# Patient Record
Sex: Male | Born: 2000 | Race: Black or African American | Hispanic: No | Marital: Single | Smoking: Never smoker
Health system: Southern US, Community
[De-identification: ages and names within clinical notes are randomized; demographics above are authoritative.]

## PROBLEM LIST (undated history)

## (undated) DIAGNOSIS — J45909 Unspecified asthma, uncomplicated: Secondary | ICD-10-CM

---
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
## 2021-01-18 ENCOUNTER — Other Ambulatory Visit: Payer: Self-pay

## 2021-01-18 ENCOUNTER — Emergency Department
Admission: EM | Admit: 2021-01-18 | Discharge: 2021-01-19 | Disposition: A | Discharge status: Home / Self Care | Payer: Medicaid Other | Attending: Emergency Medicine | Admitting: Emergency Medicine

## 2021-01-18 ENCOUNTER — Encounter: Payer: Self-pay | Admitting: Emergency Medicine

## 2021-01-18 DIAGNOSIS — J45909 Unspecified asthma, uncomplicated: Secondary | ICD-10-CM | POA: Insufficient documentation

## 2021-01-18 DIAGNOSIS — R410 Disorientation, unspecified: Secondary | ICD-10-CM | POA: Insufficient documentation

## 2021-01-18 DIAGNOSIS — F4322 Adjustment disorder with anxiety: Secondary | ICD-10-CM | POA: Diagnosis present

## 2021-01-18 DIAGNOSIS — Z20822 Contact with and (suspected) exposure to covid-19: Secondary | ICD-10-CM | POA: Diagnosis not present

## 2021-01-18 DIAGNOSIS — B89 Unspecified parasitic disease: Secondary | ICD-10-CM | POA: Insufficient documentation

## 2021-01-18 DIAGNOSIS — Y900 Blood alcohol level of less than 20 mg/100 ml: Secondary | ICD-10-CM | POA: Insufficient documentation

## 2021-01-18 HISTORY — DX: Unspecified asthma, uncomplicated: J45.909

## 2021-01-18 LAB — COMPREHENSIVE METABOLIC PANEL
ALT: 25 U/L (ref 0–44)
AST: 30 U/L (ref 15–41)
Albumin: 4.3 g/dL (ref 3.5–5.0)
Alkaline Phosphatase: 54 U/L (ref 38–126)
Anion gap: 7 (ref 5–15)
BUN: 9 mg/dL (ref 6–20)
CO2: 27 mmol/L (ref 22–32)
Calcium: 9.1 mg/dL (ref 8.9–10.3)
Chloride: 103 mmol/L (ref 98–111)
Creatinine, Ser: 1.15 mg/dL (ref 0.61–1.24)
GFR, Estimated: >60 mL/min (ref >60)
Glucose, Bld: 138 mg/dL — ABNORMAL HIGH (ref 70–99)
Potassium: 3.5 mmol/L (ref 3.5–5.1)
Sodium: 137 mmol/L (ref 135–145)
Total Bilirubin: 1.1 mg/dL (ref 0.3–1.2)
Total Protein: 8.4 g/dL — ABNORMAL HIGH (ref 6.5–8.1)

## 2021-01-18 LAB — CBC
HCT: 39.1 % (ref 39.0–52.0)
Hemoglobin: 13.8 g/dL (ref 13.0–17.0)
MCH: 30.3 pg (ref 26.0–34.0)
MCHC: 35.3 g/dL (ref 30.0–36.0)
MCV: 85.7 fL (ref 80.0–100.0)
Platelets: 286 10*3/uL (ref 150–400)
RBC: 4.56 MIL/uL (ref 4.22–5.81)
RDW: 13.1 % (ref 11.5–15.5)
WBC: 6.3 10*3/uL (ref 4.0–10.5)
nRBC: 0 % (ref 0.0–0.2)

## 2021-01-18 LAB — ETHANOL: Alcohol, Ethyl (B): <10 mg/dL (ref <10)

## 2021-01-18 LAB — ACETAMINOPHEN LEVEL: Acetaminophen (Tylenol), Serum: <10 ug/mL — ABNORMAL LOW (ref 10–30)

## 2021-01-18 LAB — SALICYLATE LEVEL: Salicylate Lvl: <7 mg/dL — ABNORMAL LOW (ref 7.0–30.0)

## 2021-01-18 MED ORDER — HYDROXYZINE HCL 25 MG PO TABS: 50.0000 mg | ORAL_TABLET | Freq: Once | ORAL | Status: DC

## 2021-01-18 NOTE — ED Notes (Signed)
Pt given sandwich tray 

## 2021-01-18 NOTE — ED Triage Notes (Addendum)
Pt via EMS from home. Pt states he has flees eating at him. Pt states that his dogs had flees 2 days ago. Pt states he is itchy and scratchy. Pt asking if we can feel the flees. Pt is calm and cooperative during triage by has rapid speech and flight ideas. Denies SI/HI. Denies AVH.

## 2021-01-18 NOTE — BH Assessment (Signed)
Writer attempted to assess patient, patient is currently unable to be aroused at this time.

## 2021-01-18 NOTE — ED Provider Notes (Signed)
[facility] Emergency Department Provider Note  ____________________________________________   Event Date/Time   First MD Initiated Contact with Patient 01/18/21 1842     (approximate)  I have reviewed the triage vital signs and the nursing notes.   HISTORY  Chief Complaint Psychiatric Evaluation    HPI Donald Bryant is a 20 y.o. male here with paranoia and sensation of bugs crawling on him.  History is somewhat limited as patient is somewhat confused and evasive.  However, he states he is here because he feels like his body has been shrinking because insects, which she describes as fleas from dogs that he has been staying with, have been living on him and biting him.  He describes generalized itchiness.  He has not been sleeping.  He is lost appetite from this.  He denies any discrete areas of pain or swelling.  No medication changes.  He denies psychiatric history.  It is unclear why he presented today.  He has no documented psychiatric history here.  Denies drug use.    Past Medical History:  Diagnosis Date   Asthma     There are no problems to display for this patient.   History reviewed. No pertinent surgical history.  Prior to Admission medications   Not on File    Allergies Patient has no known allergies.  History reviewed. No pertinent family history.  Social History Social History   Tobacco Use   Smoking status: Never   Smokeless tobacco: Never  Substance Use Topics   Alcohol use: Never   Drug use: Yes    Types: Marijuana    Review of Systems  Review of Systems  Constitutional:  Negative for chills and fever.  HENT:  Negative for sore throat.   Respiratory:  Negative for shortness of breath.   Cardiovascular:  Negative for chest pain.  Gastrointestinal:  Negative for abdominal pain.  Genitourinary:  Negative for flank pain.  Musculoskeletal:  Negative for neck pain.  Skin:  Negative for rash and wound.   Allergic/Immunologic: Negative for immunocompromised state.  Neurological:  Negative for weakness and numbness.  Hematological:  Does not bruise/bleed easily.  Psychiatric/Behavioral:  Positive for hallucinations.   All other systems reviewed and are negative.   ____________________________________________  PHYSICAL EXAM:      VITAL SIGNS: ED Triage Vitals [01/18/21 1831]  Enc Vitals Group     BP (!) 137/93     Pulse Rate 87     Resp 20     Temp 98.4 F (36.9 C)     Temp Source Oral     SpO2 100 %     Weight 150 lb (68 kg)     Height 5\' 11"  (1.803 m)     Head Circumference      Peak Flow      Pain Score 0     Pain Loc      Pain Edu?      Excl. in GC?      Physical Exam Vitals and nursing note reviewed.  Constitutional:      General: He is not in acute distress.    Appearance: He is well-developed.  HENT:     Head: Normocephalic and atraumatic.  Eyes:     Conjunctiva/sclera: Conjunctivae normal.  Cardiovascular:     Rate and Rhythm: Normal rate and regular rhythm.     Heart sounds: Normal heart sounds.  Pulmonary:     Effort: Pulmonary effort is normal. No respiratory distress.  Breath sounds: No wheezing.  Abdominal:     General: There is no distension.  Musculoskeletal:     Cervical back: Neck supple.  Skin:    General: Skin is warm.     Capillary Refill: Capillary refill takes less than 2 seconds.     Findings: No rash.     Comments: Scattered, diffuse excoriations.  No visible insects noted.  Neurological:     Mental Status: He is alert and oriented to person, place, and time.     Motor: No abnormal muscle tone.  Psychiatric:     Comments: Paranoid, endorsing seeing fleas and insects on his skin that do not appear to be apparent.  Denies suicidal homicidal ideation.      ____________________________________________   LABS (all labs ordered are listed, but only abnormal results are displayed)  Labs Reviewed  COMPREHENSIVE METABOLIC PANEL -  Abnormal; Notable for the following components:      Result Value   Glucose, Bld 138 (*)    Total Protein 8.4 (*)    All other components within normal limits  SALICYLATE LEVEL - Abnormal; Notable for the following components:   Salicylate Lvl <7.0 (*)    All other components within normal limits  ACETAMINOPHEN LEVEL - Abnormal; Notable for the following components:   Acetaminophen (Tylenol), Serum <10 (*)    All other components within normal limits  ETHANOL  CBC  URINE DRUG SCREEN, QUALITATIVE ([facility] ONLY)    ____________________________________________  EKG:  ________________________________________  RADIOLOGY All imaging, including plain films, CT scans, and ultrasounds, independently reviewed by me, and interpretations confirmed via formal radiology reads.  ED MD interpretation:     Official radiology report(s): No results found.  ____________________________________________  PROCEDURES   Procedure(s) performed (including Critical Care):  Procedures  ____________________________________________  INITIAL IMPRESSION / MDM / ASSESSMENT AND PLAN / ED COURSE  As part of my medical decision making, I reviewed the following data within the electronic MEDICAL RECORD NUMBER Nursing notes reviewed and incorporated, Old chart reviewed, Notes from prior ED visits, and [place] Controlled Substance Database       *Donald Bryant was evaluated in Emergency Department on 01/18/2021 for the symptoms described in the history of present illness. He was evaluated in the context of the global COVID-19 pandemic, which necessitated consideration that the patient might be at risk for infection with the SARS-CoV-2 virus that causes COVID-19. Institutional protocols and algorithms that pertain to the evaluation of patients at risk for COVID-19 are in a state of rapid change based on information released by regulatory bodies including the CDC and federal and state organizations. These policies and  algorithms were followed during the patient's care in the ED.  Some ED evaluations and interventions may be delayed as a result of limited staffing during the pandemic.*     Medical Decision Making: 21 year old male here with concern for delusions of parasitosis with possible hallucinations/psychosis as well.  Unclear if this is primary psychiatric issue versus substance use.  Will send screening labs.  On exam, he does have some diffuse excoriations.  I see no visualized insects, but he does reportedly live in a poor living situation.  Would consider treating him empirically for possible flea bites if cleared psychiatrically. Denies any SI, HI, and beyond his reported itching/flea exposure, he does not appear overtly dangerous to himself or others, and does not meet IVC criteria.  ____________________________________________  FINAL CLINICAL IMPRESSION(S) / ED DIAGNOSES  Final diagnoses:  Parasitosis  MEDICATIONS GIVEN DURING THIS VISIT:  Medications  hydrOXYzine (ATARAX/VISTARIL) tablet 50 mg (50 mg Oral Not Given 01/18/21 1946)     ED Discharge Orders     None        Note:  This document was prepared using Dragon voice recognition software and may include unintentional dictation errors.   Shaune Pollack, MD 01/18/21 [phone]

## 2021-01-18 NOTE — ED Notes (Addendum)
Pt belonging include (1/1):   1 pair of dark blue shorts  1 black sweatshirt  4 rings  1 necklace  1 headband  1 belly ring

## 2021-01-18 NOTE — ED Triage Notes (Signed)
Pt in via EMS from home with c/o being eaten by fleas from the inside out causing his skin to shrink. EMS reports per pt the fleas facial expressions have changed today.

## 2021-01-18 NOTE — Discharge Instructions (Addendum)
If cleared for d/c, consider permethrin for reported flea bites/infestation  [facility]  [place] Address: [address], [place], [place] [address] Hours:  Open ? Closes 5PM 4th of July might affect these hours Phone: [phone]

## 2021-01-18 NOTE — ED Notes (Signed)
VOL/pending SOC consult 

## 2021-01-19 DIAGNOSIS — F4322 Adjustment disorder with anxiety: Secondary | ICD-10-CM

## 2021-01-19 LAB — RESP PANEL BY RT-PCR (FLU A&B, COVID) ARPGX2
Influenza A by PCR: NEGATIVE
Influenza B by PCR: NEGATIVE
SARS Coronavirus 2 by RT PCR: NEGATIVE

## 2021-01-19 NOTE — ED Notes (Signed)
TTS and NP at bedside to consult patient and plan further care.

## 2021-01-19 NOTE — Consult Note (Signed)
[facility] Psych ED Discharge  01/19/2021 10:08 AM Elford [place] NO  MRN:  [id number]  Method of visit?: Face to Face   Principal Problem: Adjustment disorder with anxious mood Discharge Diagnoses: Principal Problem:   Adjustment disorder with anxious mood   Subjective: "I feel much better."  20 yo male presented after being bit by fleas and getting anxious.  He stayed at a friend's house who had fleas and he got bit which was "freaking me out."  Today after sleeping he feels much better.  He had poor sleep for the past two weeks as he has been staying at friend's homes as because he did not want to go back to stay with his grandmother.  Now, he is happy to return as she does not have fleas at her home.  Denies suicidal/homicidal ideations, hallucinations, and substance abuse.  Pleasant, engaging, and cooperative on assessment.  Psychiatrically cleared for discharge.  On admission per TTS, Jamila Handy: Patient is a 20 year old male presenting to [facility] ED voluntarily due to feeling as though flees are eating him. Per triage note Pt via EMS from home. Pt states he has flees eating at him. Pt states that his dogs had flees 2 days ago. Pt states he is itchy and scratchy. Pt asking if we can feel the flees. Pt is calm and cooperative during triage by has rapid speech and flight ideas. Denies SI/HI. Denies AVH. During assessment patient appears alert and oriented x4, calm and cooperative, mood is pleasant. Patient reports "the house that I went to it's real nasty, the 2 dogs had real bad flees and the chair that I was sitting in something told me not to sit there because I knew it had flees on it." Patient reports that he has never experienced this before "I don't live like this, I live with grandma and she doesn't live like that." Patient reports that he started feeling the flees "3 days ago." Patient denies using any drugs or alcohol. Prior to the assessment patient was sleeping in his bed and unable to be  aroused, when asked how patient had been sleeping he reports "I haven't slept in 2 weeks, this was the first time I had slept" When asked if there is anything in particular that is affecting his sleep patient reports "I have been feeling depressed, I was kicked out my mom's house, she is really religious and doesn't accept me and that bothers me." Patient is now currently living with his grandmother "my grandmother is strict but she is more understanding." Patient reports that he has been feeling depressed since the age of 29 but has never attempted to hurt himself. Patient also reports a poor appetite. When asked if patient is still feeling the flees he reports "not as much as when I first came in, I can still feel them a little." Patient denies SI/HI/AH/VH  Total Time spent with patient: 1 hour  Past Psychiatric History: anxiety, depression  Past Medical History:  Past Medical History:  Diagnosis Date   Asthma    History reviewed. No pertinent surgical history. Family History: History reviewed. No pertinent family history. Family Psychiatric  History: none Social History:  Social History   Substance and Sexual Activity  Alcohol Use Never     Social History   Substance and Sexual Activity  Drug Use Yes   Types: Marijuana    Social History   Socioeconomic History   Marital status: Single    Spouse name: Not on file  Number of children: Not on file   Years of education: Not on file   Highest education level: Not on file  Occupational History   Not on file  Tobacco Use   Smoking status: Never   Smokeless tobacco: Never  Substance and Sexual Activity   Alcohol use: Never   Drug use: Yes    Types: Marijuana   Sexual activity: Never  Other Topics Concern   Not on file  Social History Narrative   Not on file   Social Determinants of Health   Financial Resource Strain: Not on file  Food Insecurity: Not on file  Transportation Needs: Not on file  Physical Activity: Not  on file  Stress: Not on file  Social Connections: Not on file    Tobacco Cessation:  A prescription for an FDA-approved tobacco cessation medication was offered at discharge and the patient refused  Current Medications: Current Facility-Administered Medications  Medication Dose Route Frequency Provider Last Rate Last Admin   hydrOXYzine (ATARAX/VISTARIL) tablet 50 mg  50 mg Oral Once Shaune Pollack, MD       No current outpatient medications on file.   PTA Medications: (Not in a hospital admission)   Musculoskeletal: Strength & Muscle Tone: within normal limits Gait & Station: normal Patient leans: N/A  Psychiatric Specialty Exam: Physical Exam Vitals and nursing note reviewed.  Constitutional:      Appearance: Normal appearance.  HENT:     Head: Normocephalic.     Nose: Nose normal.  Pulmonary:     Effort: Pulmonary effort is normal.  Musculoskeletal:        General: Normal range of motion.     Cervical back: Normal range of motion.  Neurological:     General: No focal deficit present.     Mental Status: He is alert and oriented to person, place, and time.  Psychiatric:        Attention and Perception: Attention and perception normal.        Mood and Affect: Mood is anxious.        Speech: Speech normal.        Behavior: Behavior normal. Behavior is cooperative.        Thought Content: Thought content normal.        Cognition and Memory: Cognition and memory normal.        Judgment: Judgment normal.    Review of Systems  Psychiatric/Behavioral:  The patient is nervous/anxious.   All other systems reviewed and are negative.  Blood pressure 134/67, pulse 63, temperature 98.4 F (36.9 C), temperature source Oral, resp. rate 18, height 5\' 11"  (1.803 m), weight 68 kg, SpO2 100 %.Body mass index is 20.92 kg/m.  General Appearance: Casual  Eye Contact:  Good  Speech:  Clear and Coherent  Volume:  Normal  Mood:  Anxious  Affect:  Congruent  Thought Process:   Coherent and Descriptions of Associations: Intact  Orientation:  Full (Time, Place, and Person)  Thought Content:  WDL and Logical  Suicidal Thoughts:  No  Homicidal Thoughts:  No  Memory:  Immediate;   Good Recent;   Good Remote;   Good  Judgement:  Fair  Insight:  Good  Psychomotor Activity:  Normal  Concentration:  Concentration: Good and Attention Span: Good  Recall:  Good  Fund of Knowledge:  Good  Language:  Good  Akathisia:  No  Handed:  Right  AIMS (if indicated):     Assets:  Housing Leisure Time Physical Health Resilience  Social Support  ADL's:  Intact  Cognition:  WNL  Sleep:         Physical Exam: Physical Exam Vitals and nursing note reviewed.  Constitutional:      Appearance: Normal appearance.  HENT:     Head: Normocephalic.     Nose: Nose normal.  Pulmonary:     Effort: Pulmonary effort is normal.  Musculoskeletal:        General: Normal range of motion.     Cervical back: Normal range of motion.  Neurological:     General: No focal deficit present.     Mental Status: He is alert and oriented to person, place, and time.  Psychiatric:        Attention and Perception: Attention and perception normal.        Mood and Affect: Mood is anxious.        Speech: Speech normal.        Behavior: Behavior normal. Behavior is cooperative.        Thought Content: Thought content normal.        Cognition and Memory: Cognition and memory normal.        Judgment: Judgment normal.   Review of Systems  Psychiatric/Behavioral:  The patient is nervous/anxious.   All other systems reviewed and are negative. Blood pressure 134/67, pulse 63, temperature 98.4 F (36.9 C), temperature source Oral, resp. rate 18, height 5\' 11"  (1.803 m), weight 68 kg, SpO2 100 %. Body mass index is 20.92 kg/m.   Demographic Factors:  Male and Adolescent or young adult  Loss Factors: NA  Historical Factors: NA  Risk Reduction Factors:   Sense of responsibility to family,  Employed, Living with another person, especially a relative, and Positive social support  Continued Clinical Symptoms:  Anxiety, mild  Cognitive Features That Contribute To Risk:  None    Suicide Risk:  Minimal: No identifiable suicidal ideation.  Patients presenting with no risk factors but with morbid ruminations; may be classified as minimal risk based on the severity of the depressive symptoms    Plan Of Care/Follow-up recommendations:  Adjustment disorder with anxious mood: -Follow up with [facility]  Activity:  as tolerated Diet:  heart healthy diet  Disposition: discharge home , NP 01/19/2021, 10:08 AM

## 2021-01-19 NOTE — ED Notes (Signed)
Pt asleep, breakfast tray placed on bed.  

## 2021-01-19 NOTE — BH Assessment (Signed)
Comprehensive Clinical Assessment (CCA) Note  01/19/2021 Donald Bryant [id number]  Chief Complaint: Patient is a 20 year old male presenting to [facility] ED voluntarily due to feeling as though flees are eating him. Per triage note Pt via EMS from home. Pt states he has flees eating at him. Pt states that his dogs had flees 2 days ago. Pt states he is itchy and scratchy. Pt asking if we can feel the flees. Pt is calm and cooperative during triage by has rapid speech and flight ideas. Denies SI/HI. Denies AVH. During assessment patient appears alert and oriented x4, calm and cooperative, mood is pleasant. Patient reports "the house that I went to it's real nasty, the 2 dogs had real bad flees and the chair that I was sitting in something told me not to sit there because I knew it had flees on it." Patient reports that he has never experienced this before "I don't live like this, I live with grandma and she doesn't live like that." Patient reports that he started feeling the flees "3 days ago." Patient denies using any drugs or alcohol. Prior to the assessment patient was sleeping in his bed and unable to be aroused, when asked how patient had been sleeping he reports "I haven't slept in 2 weeks, this was the first time I had slept" When asked if there is anything in particular that is affecting his sleep patient reports "I have been feeling depressed, I was kicked out my mom's house, she is really religious and doesn't accept me and that bothers me." Patient is now currently living with his grandmother "my grandmother is strict but she is more understanding." Patient reports that he has been feeling depressed since the age of 20 but has never attempted to hurt himself. Patient also reports a poor appetite. When asked if patient is still feeling the flees he reports "not as much as when I first came in, I can still feel them a little." Patient denies SI/HI/AH/VH  Pending psyc consult Chief Complaint  Patient  presents with   Psychiatric Evaluation       CCA Screening, Triage and Referral (STR)  Patient Reported Information How did you hear about us? Self  Referral name: No data recorded Referral phone number: No data recorded  Whom do you see for routine medical problems? No data recorded Practice/Facility Name: No data recorded Practice/Facility Phone Number: No data recorded Name of Contact: No data recorded Contact Number: No data recorded Contact Fax Number: No data recorded Prescriber Name: No data recorded Prescriber Address (if known): No data recorded  What Is the Reason for Your Visit/Call Today? Patient presented voluntarily due to feeling as though flees are crawling on him  How Long Has This Been Causing You Problems? <Week  What Do You Feel Would Help You the Most Today? No data recorded  Have You Recently Been in Any Inpatient Treatment (Hospital/Detox/Crisis Center/28-Day Program)? No data recorded Name/Location of Program/Hospital:No data recorded How Long Were You There? No data recorded When Were You Discharged? No data recorded  Have You Ever Received Services From [facility] Before? No data recorded Who Do You See at [facility]? No data recorded  Have You Recently Had Any Thoughts About Hurting Yourself? No  Are You Planning to Commit Suicide/Harm Yourself At This time? No   Have you Recently Had Thoughts About Hurting Someone Karolee Ohslse? No  Explanation: No data recorded  Have You Used Any Alcohol or Drugs in the Past 24 Hours?  No  How Long Ago Did You Use Drugs or Alcohol? No data recorded What Did You Use and How Much? No data recorded  Do You Currently Have a Therapist/Psychiatrist? No  Name of Therapist/Psychiatrist: No data recorded  Have You Been Recently Discharged From Any Office Practice or Programs? No  Explanation of Discharge From Practice/Program: No data recorded    CCA Screening Triage Referral Assessment Type of Contact:  Face-to-Face  Is this Initial or Reassessment? No data recorded Date Telepsych consult ordered in [facility]:  No data recorded Time Telepsych consult ordered in [facility]:  No data recorded  Patient Reported Information Reviewed? No data recorded Patient Left Without Being Seen? No data recorded Reason for Not Completing Assessment: No data recorded  Collateral Involvement: No data recorded  Does Patient Have a Court Appointed Legal Guardian? No data recorded Name and Contact of Legal Guardian: No data recorded If Minor and Not Living with Parent(s), Who has Custody? No data recorded Is CPS involved or ever been involved? Never  Is APS involved or ever been involved? Never   Patient Determined To Be At Risk for Harm To Self or Others Based on Review of Patient Reported Information or Presenting Complaint? No  Method: No data recorded Availability of Means: No data recorded Intent: No data recorded Notification Required: No data recorded Additional Information for Danger to Others Potential: No data recorded Additional Comments for Danger to Others Potential: No data recorded Are There Guns or Other Weapons in Your Home? No data recorded Types of Guns/Weapons: No data recorded Are These Weapons Safely Secured?                            No data recorded Who Could Verify You Are Able To Have These Secured: No data recorded Do You Have any Outstanding Charges, Pending Court Dates, Parole/Probation? No data recorded Contacted To Inform of Risk of Harm To Self or Others: No data recorded  Location of Assessment: [facility] ED   Does Patient Present under Involuntary Commitment? No  IVC Papers Initial File Date: No data recorded  [place] of Residence: [place]   Patient Currently Receiving the Following Services: No data recorded  Determination of Need: Emergent (2 hours)   Options For Referral: No data recorded    CCA Biopsychosocial Intake/Chief Complaint:  No data recorded Current  Symptoms/Problems: No data recorded  Patient Reported Schizophrenia/Schizoaffective Diagnosis in Past: No   Strengths: Patient is able to communicate and complete his ADLs  Preferences: No data recorded Abilities: No data recorded  Type of Services Patient Feels are Needed: No data recorded  Initial Clinical Notes/Concerns: No data recorded  Mental Health Symptoms Depression:   Change in energy/activity; Difficulty Concentrating; Sleep (too much or little)   Duration of Depressive symptoms:  Greater than two weeks   Mania:   None   Anxiety:    None   Psychosis:   Delusions   Duration of Psychotic symptoms:  Less than six months   Trauma:   None   Obsessions:   None   Compulsions:   None   Inattention:   None   Hyperactivity/Impulsivity:   None   Oppositional/Defiant Behaviors:   None   Emotional Irregularity:   None   Other Mood/Personality Symptoms:  No data recorded   Mental Status Exam Appearance and self-care  Stature:   Average   Weight:   Average weight   Clothing:   Casual  Grooming:   Normal   Cosmetic use:   None   Posture/gait:   Normal   Motor activity:   Not Remarkable   Sensorium  Attention:   Normal   Concentration:   Normal   Orientation:   X5   Recall/memory:   Normal   Affect and Mood  Affect:   Appropriate   Mood:   Other (Comment) (Pleasant)   Relating  Eye contact:   Normal   Facial expression:   Responsive   Attitude toward examiner:   Cooperative   Thought and Language  Speech flow:  Clear and Coherent   Thought content:   Appropriate to Mood and Circumstances   Preoccupation:   None   Hallucinations:   None   Organization:  No data recorded  [employer] of Knowledge:   Good   Intelligence:   Average   Abstraction:   Normal   Judgement:   Good   Reality Testing:   Realistic   Insight:   Good   Decision Making:   Normal   Social  Functioning  Social Maturity:   Responsible   Social Judgement:   Normal   Stress  Stressors:   Family conflict; Financial   Coping Ability:   Normal   Skill Deficits:   None   Supports:   Family; Friends/Service system     Religion: Religion/Spirituality Are You A Religious Person?: No  Leisure/Recreation: Leisure / Recreation Do You Have Hobbies?: No  Exercise/Diet: Exercise/Diet Do You Exercise?: No Have You Gained or Lost A Significant Amount of Weight in the Past Six Months?: No Do You Follow a Special Diet?: No Do You Have Any Trouble Sleeping?: Yes Explanation of Sleeping Difficulties: Patient reports that he has been awake for 2 weeks   CCA Employment/Education Employment/Work Situation: Employment / Work Situation Employment Situation: Unemployed Patient's Job has Been Impacted by Current Illness: No Has Patient ever Been in Equities trader?: No  Education: Education Is Patient Currently Attending School?: No Did You Have An Individualized Education Program (IIEP): No Did You Have Any Difficulty At [employer]?: No Patient's Education Has Been Impacted by Current Illness: No   CCA Family/Childhood History Family and Relationship History: Family history Marital status: Single Does patient have children?: No  Childhood History:  Childhood History By whom was/is the patient raised?: Mother Did patient suffer any verbal/emotional/physical/sexual abuse as a child?: No Did patient suffer from severe childhood neglect?: No Has patient ever been sexually abused/assaulted/raped as an adolescent or adult?: No Was the patient ever a victim of a crime or a disaster?: No Witnessed domestic violence?: No Has patient been affected by domestic violence as an adult?: No  Child/Adolescent Assessment:     CCA Substance Use Alcohol/Drug Use: Alcohol / Drug Use Pain Medications: See MAR Prescriptions: See MAR Over the Counter: See MAR History of alcohol /  drug use?: No history of alcohol / drug abuse                         ASAM's:  Six Dimensions of Multidimensional Assessment  Dimension 1:  Acute Intoxication and/or Withdrawal Potential:      Dimension 2:  Biomedical Conditions and Complications:      Dimension 3:  Emotional, Behavioral, or Cognitive Conditions and Complications:     Dimension 4:  Readiness to Change:     Dimension 5:  Relapse, Continued use, or Continued Problem Potential:     Dimension 6:  Recovery/Living Environment:     ASAM Severity Score:    ASAM Recommended Level of Treatment:     Substance use Disorder (SUD)    Recommendations for Services/Supports/Treatments:  Pending Psyc consult  DSM5 Diagnoses: There are no problems to display for this patient.   Patient Centered Plan: Patient is on the following Treatment Plan(s):  Anxiety   Referrals to Alternative Service(s): Referred to Alternative Service(s):   Place:   Date:   Time:    Referred to Alternative Service(s):   Place:   Date:   Time:    Referred to Alternative Service(s):   Place:   Date:   Time:    Referred to Alternative Service(s):   Place:   Date:   Time:     Donald Bryant, LCAS-A

## 2024-01-14 ENCOUNTER — Emergency Department
Admission: EM | Admit: 2024-01-14 | Discharge: 2024-01-14 | Disposition: A | Discharge status: Home / Self Care | Payer: Self-pay | Attending: Emergency Medicine | Admitting: Emergency Medicine

## 2024-01-14 ENCOUNTER — Other Ambulatory Visit: Payer: Self-pay

## 2024-01-14 DIAGNOSIS — J029 Acute pharyngitis, unspecified: Secondary | ICD-10-CM | POA: Insufficient documentation

## 2024-01-14 DIAGNOSIS — Z202 Contact with and (suspected) exposure to infections with a predominantly sexual mode of transmission: Secondary | ICD-10-CM | POA: Insufficient documentation

## 2024-01-14 DIAGNOSIS — J45909 Unspecified asthma, uncomplicated: Secondary | ICD-10-CM | POA: Insufficient documentation

## 2024-01-14 DIAGNOSIS — R110 Nausea: Secondary | ICD-10-CM | POA: Insufficient documentation

## 2024-01-14 DIAGNOSIS — R109 Unspecified abdominal pain: Secondary | ICD-10-CM | POA: Insufficient documentation

## 2024-01-14 LAB — RESP PANEL BY RT-PCR (RSV, FLU A&B, COVID)  RVPGX2
Influenza A by PCR: NEGATIVE
Influenza B by PCR: NEGATIVE
Resp Syncytial Virus by PCR: NEGATIVE
SARS Coronavirus 2 by RT PCR: NEGATIVE

## 2024-01-14 LAB — CHLAMYDIA/NGC RT PCR (ARMC ONLY)
Chlamydia Tr: NOT DETECTED
N gonorrhoeae: NOT DETECTED

## 2024-01-14 NOTE — ED Notes (Signed)
 Patient declined discharge vital signs.

## 2024-01-14 NOTE — ED Triage Notes (Signed)
 Pt to ED via POV from home. Pt reports flu like symptoms x2 days. Pt works at [employer] and unsure of sick contacts. Pt reports possible exposure to aids and wants testing. Pt states is transgender and has been with sexual partner x3 months.

## 2024-01-14 NOTE — ED Notes (Signed)
 Patient ambulated with a steady gait to hallway bathroom. No signs of dyspnea.

## 2024-01-14 NOTE — Discharge Instructions (Signed)
 Please make sure to follow-up on your labs in your MyChart.

## 2024-01-14 NOTE — ED Provider Notes (Signed)
 Donald Bryant Provider Note    Event Date/Time   First MD Initiated Contact with Patient 01/14/24 1754     (approximate)   History   No chief complaint on file.   HPI  Donald Bryant is a 23 y.o. male transgender male, history of asthma presenting with abdominal cramping, nausea, sore throat.  No fever or cough, no vomiting or diarrhea, no urinary symptoms.  No penile discharge or rash.  States that they found out recently that her partner's is positive and wants HIV as well as GC chlamydia testing.  Does not want empiric treatment.      Physical Exam   Triage Vital Signs: ED Triage Vitals [01/14/24 1754]  Encounter Vitals Group     BP (!) 179/106     Girls Systolic BP Percentile      Girls Diastolic BP Percentile      Boys Systolic BP Percentile      Boys Diastolic BP Percentile      Pulse Rate 80     Resp 20     Temp 98.1 F (36.7 C)     Temp Source Oral     SpO2 98 %     Weight      Height      Head Circumference      Peak Flow      Pain Score      Pain Loc      Pain Education      Exclude from Growth Chart     Most recent vital signs: Vitals:   01/14/24 1754  BP: (!) 179/106  Pulse: 80  Resp: 20  Temp: 98.1 F (36.7 C)  SpO2: 98%     General: Awake, no distress.  CV:  Good peripheral perfusion.  Resp:  Normal effort.  No tachypnea or respiratory distress Abd:  No distention.  Soft nontender Other:  Clear oropharynx   ED Results / Procedures / Treatments   Labs (all labs ordered are listed, but only abnormal results are displayed) Labs Reviewed  RESP PANEL BY RT-PCR (RSV, FLU A&B, COVID)  [id number]  CHLAMYDIA/NGC RT PCR ([facility] ONLY)            HIV ANTIBODY (ROUTINE TESTING W REFLEX)     PROCEDURES:  Critical Care performed: No  Procedures   MEDICATIONS ORDERED IN ED: Medications - No data to display   IMPRESSION / MDM / ASSESSMENT AND PLAN / ED COURSE  I reviewed the triage vital signs and the nursing  notes.                              Differential diagnosis includes, but is not limited to, viral illness, COVID, influenza, RSV, STI exposure, will send for HIV, GC chlamydia.  Abdomen soft nontender at this time, no indication for imaging.  Patient is well-appearing.  Respiratory viral swab was sent in the triage.  Discussed with them about following up results outpatient since the HIV as well as GC chlamydia can take time to return.  They are agreeable with this plan.  Social determinants of health, lack of primary care.  Patient's presentation is most consistent with acute, uncomplicated illness.  Independent interpretation of labs below.  Patient stable for outpatient management.  Considered but no indication for inpatient admission at this time.  They know to follow-up on STI testing.  Referral was placed prior primary care.  Shared decision making  done with patient and they are agreeable with this plan.  Will discharge with strict return precautions.    Clinical Course as of 01/14/24 1844  Sat Jan 14, 2024  1842 Resp panel by RT-PCR (RSV, Flu A&B, Covid) Anterior Nasal Swab neg [TT]    Clinical Course User Index [TT] Waymond Lorelle Cummins, MD     FINAL CLINICAL IMPRESSION(S) / ED DIAGNOSES   Final diagnoses:  Sore throat  Abdominal cramping  Possible exposure to STI     Rx / DC Orders   ED Discharge Orders          Ordered    Ambulatory Referral to Primary Care (Establish Care)        01/14/24 1805             Note:  This document was prepared using Dragon voice recognition software and may include unintentional dictation errors.    Waymond Lorelle Cummins, MD 01/14/24 [phone]

## 2024-01-15 LAB — HIV ANTIBODY (ROUTINE TESTING W REFLEX): HIV Screen 4th Generation wRfx: NONREACTIVE

## 2024-07-28 ENCOUNTER — Emergency Department
Admission: EM | Admit: 2024-07-28 | Discharge: 2024-07-28 | Disposition: A | Discharge status: Home / Self Care | Payer: Self-pay | Attending: Emergency Medicine | Admitting: Emergency Medicine

## 2024-07-28 ENCOUNTER — Emergency Department: Payer: Self-pay

## 2024-07-28 ENCOUNTER — Other Ambulatory Visit: Payer: Self-pay

## 2024-07-28 DIAGNOSIS — R55 Syncope and collapse: Secondary | ICD-10-CM | POA: Insufficient documentation

## 2024-07-28 DIAGNOSIS — J45909 Unspecified asthma, uncomplicated: Secondary | ICD-10-CM | POA: Insufficient documentation

## 2024-07-28 LAB — URINALYSIS, ROUTINE W REFLEX MICROSCOPIC
Bilirubin Urine: NEGATIVE
Glucose, UA: NEGATIVE mg/dL
Hgb urine dipstick: NEGATIVE
Ketones, ur: 5 mg/dL — AB
Leukocytes,Ua: NEGATIVE
Nitrite: NEGATIVE
Protein, ur: NEGATIVE mg/dL
Specific Gravity, Urine: 1.015 (ref 1.005–1.030)
pH: 5 (ref 5.0–8.0)

## 2024-07-28 LAB — CBC
HCT: 44.8 % (ref 39.0–52.0)
Hemoglobin: 15.7 g/dL (ref 13.0–17.0)
MCH: 29.4 pg (ref 26.0–34.0)
MCHC: 35 g/dL (ref 30.0–36.0)
MCV: 83.9 fL (ref 80.0–100.0)
Platelets: 427 K/uL — ABNORMAL HIGH (ref 150–400)
RBC: 5.34 MIL/uL (ref 4.22–5.81)
RDW: 12.5 % (ref 11.5–15.5)
WBC: 10.5 K/uL (ref 4.0–10.5)
nRBC: 0 % (ref 0.0–0.2)

## 2024-07-28 LAB — COMPREHENSIVE METABOLIC PANEL WITH GFR
ALT: 15 U/L (ref 0–44)
AST: 24 U/L (ref 15–41)
Albumin: 4.5 g/dL (ref 3.5–5.0)
Alkaline Phosphatase: 69 U/L (ref 38–126)
Anion gap: 13 (ref 5–15)
BUN: 10 mg/dL (ref 6–20)
CO2: 24 mmol/L (ref 22–32)
Calcium: 10 mg/dL (ref 8.9–10.3)
Chloride: 101 mmol/L (ref 98–111)
Creatinine, Ser: 1.23 mg/dL (ref 0.61–1.24)
GFR, Estimated: >60 mL/min
Glucose, Bld: 110 mg/dL — ABNORMAL HIGH (ref 70–99)
Potassium: 4.4 mmol/L (ref 3.5–5.1)
Sodium: 138 mmol/L (ref 135–145)
Total Bilirubin: 0.7 mg/dL (ref 0.0–1.2)
Total Protein: 8.4 g/dL — ABNORMAL HIGH (ref 6.5–8.1)

## 2024-07-28 LAB — T4, FREE: Free T4: 1.53 ng/dL (ref 0.80–2.00)

## 2024-07-28 LAB — MAGNESIUM: Magnesium: 1.8 mg/dL (ref 1.7–2.4)

## 2024-07-28 LAB — TSH: TSH: 2.79 u[IU]/mL (ref 0.350–4.500)

## 2024-07-28 MED ORDER — IOHEXOL 350 MG/ML SOLN
75.0000 mL | Freq: Once | INTRAVENOUS | Status: AC | PRN
  Administered 2024-07-28: 75 mL via INTRAVENOUS

## 2024-07-28 NOTE — ED Notes (Signed)
 Pt discharged to ED circle at this time and left with all belongings. Pt ABCs intact. RR even and unlabored. Pt in NAD. Pt denies further needs from this RN.

## 2024-07-28 NOTE — Discharge Instructions (Signed)
 Fortunately your evaluation in the emerged part did not show any emergency conditions that related to your fainting episode tonight.  I made a referral to a primary doctor who will call you to schedule a follow-up appointment.  Please make sure to stay well-hydrated as your lack of food/drink today may have contributed to your passing out episode  Thank you for choosing us  for your health care today!  Please see your primary doctor this week for a follow up appointment.   If you have any new, worsening, or unexpected symptoms call your doctor right away or come back to the emergency department for reevaluation.  It was my pleasure to care for you today.   Ginnie EDISON Cyrena, MD

## 2024-07-28 NOTE — ED Provider Notes (Signed)
 "  [facility] Provider Note    Event Date/Time   First MD Initiated Contact with Patient 07/28/24 [phone]     (approximate)   History   Loss of Consciousness   HPI  Donald Bryant is a 24 y.o. male   Past medical history of asthma who presents to the Emergency Department with a syncopal episode.  He states he did not eat or drink much today but was otherwise in his regular state of health denies any abdominal pain nausea vomiting or diarrhea or other recent illnesses and he stood up from a seated position and began texting. He knew he woke up on the ground about 15 minutes later.  His mentation was sharp at the time he was not confused and knew where he was.  He denies any presyncopal symptoms such as palpitations, lightheadedness, vision changes and remembers texting a friend on the phone just prior to his waking up on the ground.  He reports no oral trauma nor any incontinence.  Again he states he was in his normal state of health prior but did not eat or drink much today.  He has had no seizure history and has never passed out before.  Denies chest pain shortness of breath or palpitations.  After he awoke he has a mild frontal headache.  It is not severe.  He has no vision changes.  He mostly feels well now aside from his mild frontal headache.  He denies drug or alcohol use.  He has no other acute medical complaints.  External Medical Documents Reviewed: Prior hospital notes      Physical Exam   Triage Vital Signs: ED Triage Vitals  Encounter Vitals Group     BP 07/28/24 0249 (!) 145/91     Girls Systolic BP Percentile --      Girls Diastolic BP Percentile --      Boys Systolic BP Percentile --      Boys Diastolic BP Percentile --      Pulse Rate 07/28/24 0249 95     Resp 07/28/24 0249 18     Temp 07/28/24 0249 99 F (37.2 C)     Temp Source 07/28/24 0249 Oral     SpO2 07/28/24 0249 97 %     Weight 07/28/24 0250 170 lb (77.1 kg)      Height 07/28/24 0250 5' 11 (1.803 m)     Head Circumference --      Peak Flow --      Pain Score 07/28/24 0250 7     Pain Loc --      Pain Education --      Exclude from Growth Chart --     Most recent vital signs: Vitals:   07/28/24 0249  BP: (!) 145/91  Pulse: 95  Resp: 18  Temp: 99 F (37.2 C)  SpO2: 97%    General: Awake, no distress.  CV:  Good peripheral perfusion.  Resp:  Normal effort.  Abd:  No distention.  Other:  Awake alert pleasant gentleman on his phone, with normal vital signs albeit slightly hypertensive.  Neck supple full range of motion no obvious signs of head trauma.  Moving all extremities full active range of motion, no motor or sensory deficits, soft benign abdominal exam, clear lungs to auscultation.   ED Results / Procedures / Treatments   Labs (all labs ordered are listed, but only abnormal results are displayed) Labs Reviewed  COMPREHENSIVE METABOLIC PANEL WITH GFR - Abnormal;  Notable for the following components:      Result Value   Glucose, Bld 110 (*)    Total Protein 8.4 (*)    All other components within normal limits  CBC - Abnormal; Notable for the following components:   Platelets 427 (*)    All other components within normal limits  URINALYSIS, ROUTINE W REFLEX MICROSCOPIC - Abnormal; Notable for the following components:   Color, Urine YELLOW (*)    APPearance HAZY (*)    Ketones, ur 5 (*)    All other components within normal limits  MAGNESIUM  TSH  T4, FREE  CBG MONITORING, ED     I ordered and reviewed the above labs they are notable for cell counts and electrolytes largely unremarkable.  EKG  ED ECG REPORT I, Ginnie Shams, the attending physician, personally viewed and interpreted this ECG.   Date: 07/28/2024  EKG Time: 0254  Rate: 91  Rhythm: sinus  Axis: nl  Intervals:nl  ST&T Change: no stemi    RADIOLOGY I independently reviewed and interpreted CT of the head and see no obvious bleeding or midline  shift I also reviewed radiologist's formal read.   PROCEDURES:  Critical Care performed: No  Procedures   MEDICATIONS ORDERED IN ED: Medications  iohexol  (OMNIPAQUE ) 350 MG/ML injection 75 mL (75 mLs Intravenous Contrast Given 07/28/24 0424)     IMPRESSION / MDM / ASSESSMENT AND PLAN / ED COURSE  I reviewed the triage vital signs and the nursing notes.                                Patient's presentation is most consistent with acute presentation with potential threat to life or bodily function.  Differential diagnosis includes, but is not limited to, orthostatic hypotension leading to syncope, vasovagal syncope, cardiac syncope, arrhythmia, electrolyte derangements, dehydration, ICH or other head injury, aneurysmal bleed SAH, seizure   The patient is on the cardiac monitor to evaluate for evidence of arrhythmia and/or significant heart rate changes.  MDM:    First-time syncopal event with no preceding presyncopal symptoms no witness to relay any information about seizure activity.  Evaluate for possible first-time seizure versus syncope.  Getting basic labs, electrolytes, EKG, head imaging.  Considered but less likely spontaneous head bleed or aneurysmal bleed leading to the syncopal event given that he had no headache prior, and likely a mild frontal headache from his fall and head strike after syncope.  Will get CT angiogram head and neck to assess for these possibilities.  Feels very well now.  Thus far workup including labs, EKG unremarkable.  Assuming remainder of lab testing and head imaging normal and he continues to feel well plan will be for discharge and PMD referral.  Return precautions discussed        FINAL CLINICAL IMPRESSION(S) / ED DIAGNOSES   Final diagnoses:  Syncope and collapse     Rx / DC Orders   ED Discharge Orders          Ordered    Ambulatory Referral to Primary Care (Establish Care)        07/28/24 0435             Note:   This document was prepared using Dragon voice recognition software and may include unintentional dictation errors.    Shams Ginnie, MD 07/28/24 [phone]  "

## 2024-07-28 NOTE — ED Triage Notes (Signed)
 Pt presents for syncopal episode at home. States he was standing up and just woke up on the ground. Endorsing mild headache. Denies dizziness, nausea, vomiting or injury. States he has not been able to eat/drink much today. Denies drug and alcohol.

## 2024-07-28 NOTE — ED Notes (Signed)
 Pt reporting to ED after having a syncopal episode at home, alone. Pt says he remembered standing up and then waking up on the ground, he is now endorsing a mild headache. Aside from that, the pt says he feels back to normal. Unknown if pt hit head on anything during the fall, not currently on blood thinners. Pt ABCs intact. RR even and unlabored. Pt in NAD. Bed in lowest locked position. Call bell in reach. Denies needs at this time.   Past Medical History:  Diagnosis Date   Asthma
